# Patient Record
Sex: Male | Born: 1970 | Race: White | Hispanic: No | Marital: Married
Health system: Southern US, Community
[De-identification: ages and names within clinical notes are randomized; demographics above are authoritative.]

## PROBLEM LIST (undated history)

## (undated) DIAGNOSIS — M5136 Other intervertebral disc degeneration, lumbar region: Secondary | ICD-10-CM

## (undated) DIAGNOSIS — G8929 Other chronic pain: Secondary | ICD-10-CM

## (undated) DIAGNOSIS — M549 Dorsalgia, unspecified: Secondary | ICD-10-CM

## (undated) DIAGNOSIS — I10 Essential (primary) hypertension: Secondary | ICD-10-CM

## (undated) DIAGNOSIS — M51369 Other intervertebral disc degeneration, lumbar region without mention of lumbar back pain or lower extremity pain: Secondary | ICD-10-CM

---

## 2015-02-05 ENCOUNTER — Encounter (HOSPITAL_BASED_OUTPATIENT_CLINIC_OR_DEPARTMENT_OTHER): Payer: Self-pay | Admitting: *Deleted

## 2015-02-05 ENCOUNTER — Emergency Department (HOSPITAL_BASED_OUTPATIENT_CLINIC_OR_DEPARTMENT_OTHER)
Admission: EM | Admit: 2015-02-05 | Discharge: 2015-02-05 | Disposition: A | Discharge status: Home / Self Care | Payer: Medicare Other | Attending: Emergency Medicine | Admitting: Emergency Medicine

## 2015-02-05 ENCOUNTER — Emergency Department (HOSPITAL_BASED_OUTPATIENT_CLINIC_OR_DEPARTMENT_OTHER): Payer: Medicare Other

## 2015-02-05 DIAGNOSIS — R600 Localized edema: Secondary | ICD-10-CM | POA: Diagnosis not present

## 2015-02-05 DIAGNOSIS — G8929 Other chronic pain: Secondary | ICD-10-CM | POA: Insufficient documentation

## 2015-02-05 DIAGNOSIS — R0981 Nasal congestion: Secondary | ICD-10-CM | POA: Diagnosis not present

## 2015-02-05 DIAGNOSIS — I10 Essential (primary) hypertension: Secondary | ICD-10-CM | POA: Diagnosis not present

## 2015-02-05 DIAGNOSIS — R51 Headache: Secondary | ICD-10-CM | POA: Insufficient documentation

## 2015-02-05 DIAGNOSIS — Z79899 Other long term (current) drug therapy: Secondary | ICD-10-CM | POA: Diagnosis not present

## 2015-02-05 DIAGNOSIS — M545 Low back pain: Secondary | ICD-10-CM | POA: Insufficient documentation

## 2015-02-05 DIAGNOSIS — M7989 Other specified soft tissue disorders: Secondary | ICD-10-CM | POA: Diagnosis present

## 2015-02-05 HISTORY — DX: Other intervertebral disc degeneration, lumbar region: M51.36

## 2015-02-05 HISTORY — DX: Other chronic pain: G89.29

## 2015-02-05 HISTORY — DX: Other intervertebral disc degeneration, lumbar region without mention of lumbar back pain or lower extremity pain: M51.369

## 2015-02-05 HISTORY — DX: Essential (primary) hypertension: I10

## 2015-02-05 HISTORY — DX: Dorsalgia, unspecified: M54.9

## 2015-02-05 LAB — BASIC METABOLIC PANEL
Anion gap: 4 — ABNORMAL LOW (ref 5–15)
BUN: 14 mg/dL (ref 6–23)
CHLORIDE: 103 mmol/L (ref 96–112)
CO2: 29 mmol/L (ref 19–32)
Calcium: 8.2 mg/dL — ABNORMAL LOW (ref 8.4–10.5)
Creatinine, Ser: 1.07 mg/dL (ref 0.50–1.35)
GFR calc non Af Amer: 84 mL/min — ABNORMAL LOW (ref >90)
Glucose, Bld: 125 mg/dL — ABNORMAL HIGH (ref 70–99)
POTASSIUM: 3.6 mmol/L (ref 3.5–5.1)
SODIUM: 136 mmol/L (ref 135–145)

## 2015-02-05 LAB — CBC WITH DIFFERENTIAL/PLATELET
BASOS ABS: 0 10*3/uL (ref 0.0–0.1)
Basophils Relative: 0 % (ref 0–1)
EOS PCT: 2 % (ref 0–5)
Eosinophils Absolute: 0.2 10*3/uL (ref 0.0–0.7)
HCT: 39.5 % (ref 39.0–52.0)
Hemoglobin: 13.3 g/dL (ref 13.0–17.0)
LYMPHS ABS: 2.2 10*3/uL (ref 0.7–4.0)
LYMPHS PCT: 19 % (ref 12–46)
MCH: 30.2 pg (ref 26.0–34.0)
MCHC: 33.7 g/dL (ref 30.0–36.0)
MCV: 89.8 fL (ref 78.0–100.0)
Monocytes Absolute: 0.8 10*3/uL (ref 0.1–1.0)
Monocytes Relative: 7 % (ref 3–12)
Neutro Abs: 8.2 10*3/uL — ABNORMAL HIGH (ref 1.7–7.7)
Neutrophils Relative %: 72 % (ref 43–77)
PLATELETS: 337 10*3/uL (ref 150–400)
RBC: 4.4 MIL/uL (ref 4.22–5.81)
RDW: 14.5 % (ref 11.5–15.5)
WBC: 11.5 10*3/uL — ABNORMAL HIGH (ref 4.0–10.5)

## 2015-02-05 LAB — BRAIN NATRIURETIC PEPTIDE: B NATRIURETIC PEPTIDE 5: 14.7 pg/mL (ref 0.0–100.0)

## 2015-02-05 MED ORDER — TRIAMTERENE-HCTZ 37.5-25 MG PO TABS: 1.0000 | ORAL_TABLET | Freq: Every day | ORAL | Status: AC

## 2015-02-05 NOTE — ED Provider Notes (Signed)
CSN: [id number]     Arrival date & time 02/05/15  [id number] History   First MD Initiated Contact with Patient 02/05/15 1000     Chief Complaint  Patient presents with  . Foot Swelling     (Consider location/radiation/quality/duration/timing/severity/associated sxs/prior Treatment) HPI  44 year old male presents with bilateral lower extremity swelling that started yesterday. He states that he is currently on lisinopril for hypertension but does not take it often because it makes him cough. He has brought this up to his PCP but it has not been changed. He last took his blood pressure medicines 3 days ago. Before that it was even longer. The patient thought his hands were swollen yesterday but this is improved. He denies chest pain, shortness of breath, or current headache. Has been having intermittent headaches before today but none now. No vomiting or focal weakness. Is complaining of chronic low back pain that is not worse than typical.  Past Medical History  Diagnosis Date  . Hypertension   . Chronic back pain   . DDD (degenerative disc disease), lumbar    No past surgical history on file. No family history on file. History  Substance Use Topics  . Smoking status: Not on file  . Smokeless tobacco: Not on file  . Alcohol Use: Not on file    Review of Systems  Constitutional: Negative for fever.  HENT: Positive for congestion (from allergies).   Respiratory: Negative for shortness of breath.   Cardiovascular: Positive for leg swelling. Negative for chest pain.  Gastrointestinal: Negative for vomiting and abdominal pain.  Genitourinary: Negative for decreased urine volume.  Musculoskeletal: Positive for back pain.  Neurological: Positive for headaches. Negative for weakness and numbness.  All other systems reviewed and are negative.     Allergies  Hydrocodone  Home Medications   Prior to Admission medications   Medication Sig Start Date End Date Taking? Authorizing Provider   LISINOPRIL PO Take by mouth.   Yes Historical Provider, MD   BP 160/96 mmHg  Pulse 88  Temp(Src) 97.8 F (36.6 C) (Oral)  Resp 20  Ht 5\' 11"  (1.803 m)  Wt 220 lb (99.791 kg)  BMI 30.70 kg/m2  SpO2 99% Physical Exam  Constitutional: He is oriented to person, place, and time. He appears well-developed and well-nourished.  HENT:  Head: Normocephalic and atraumatic.  Right Ear: External ear normal.  Left Ear: External ear normal.  Nose: Nose normal.  Eyes: Right eye exhibits no discharge. Left eye exhibits no discharge.  Neck: Neck supple.  Cardiovascular: Normal rate, regular rhythm, normal heart sounds and intact distal pulses.   Pulmonary/Chest: Effort normal.  Mild lower lung crackles bilaterally  Abdominal: Soft. There is no tenderness.  Musculoskeletal: He exhibits edema (1+ pedal edema bilaterally).  Neurological: He is alert and oriented to person, place, and time.  Skin: Skin is warm and dry.  Nursing note and vitals reviewed.   ED Course  Procedures (including critical care time) Labs Review Labs Reviewed  BASIC METABOLIC PANEL - Abnormal; Notable for the following:    Glucose, Bld 125 (*)    Calcium 8.2 (*)    GFR calc non Af Amer 84 (*)    Anion gap 4 (*)    All other components within normal limits  CBC WITH DIFFERENTIAL/PLATELET - Abnormal; Notable for the following:    WBC 11.5 (*)    Neutro Abs 8.2 (*)    All other components within normal limits  BRAIN NATRIURETIC PEPTIDE  Imaging Review Dg Chest 2 View  02/05/2015   CLINICAL DATA:  Bilateral lower extremity swelling.  Smoker.  EXAM: CHEST  2 VIEW  COMPARISON:  Edit  FINDINGS: There is no pulmonary edema or consolidative process. Bronchitic change is noted. Heart size is normal. No pneumothorax or pleural effusion.  IMPRESSION: Bronchitic change.  No focal abnormality.   Electronically Signed   By: Drusilla Kanner M.D.   On: 02/05/2015 10:41     EKG Interpretation None      MDM   Final  diagnoses:  Essential hypertension  Pedal edema    Given mild crackles heard on exam, chest x-ray and labs obtained. His labs are unremarkable. The patient's creatinine is within normal limits. Given he does not like being on his lisinopril and has mild pedal edema, will prescribe triamterene-hydrochlorothiazide to help with both blood pressure and leg swelling. Potassium is on the low end of normal. As for his chronic back pain he has no acute neurologic symptoms now do not feel narcotics are warranted at this time. Recommend follow-up as soon as possible with his PCP.    Pricilla Loveless, MD 02/05/15 1331

## 2015-02-05 NOTE — ED Notes (Addendum)
Patient states he is in [place] visiting a friend and has not had his blood pressure medications for three weeks.  States he has not been compliant with using the Lisinopril, because it makes him cough and his PCP will not change it.  States yesterday he noticed his feet and legs swelling and when waking up today, the swelling was worse.  States he also has lower back pain.

## 2015-04-11 DEATH — deceased

## 2016-12-12 IMAGING — DX DG CHEST 2V
2 series · 2 of 2 positions shown · non-contrast
Comparison: Hospital

CLINICAL DATA: Bilateral lower extremity swelling.  Smoker.

EXAM:
CHEST  2 VIEW

[chest pa]
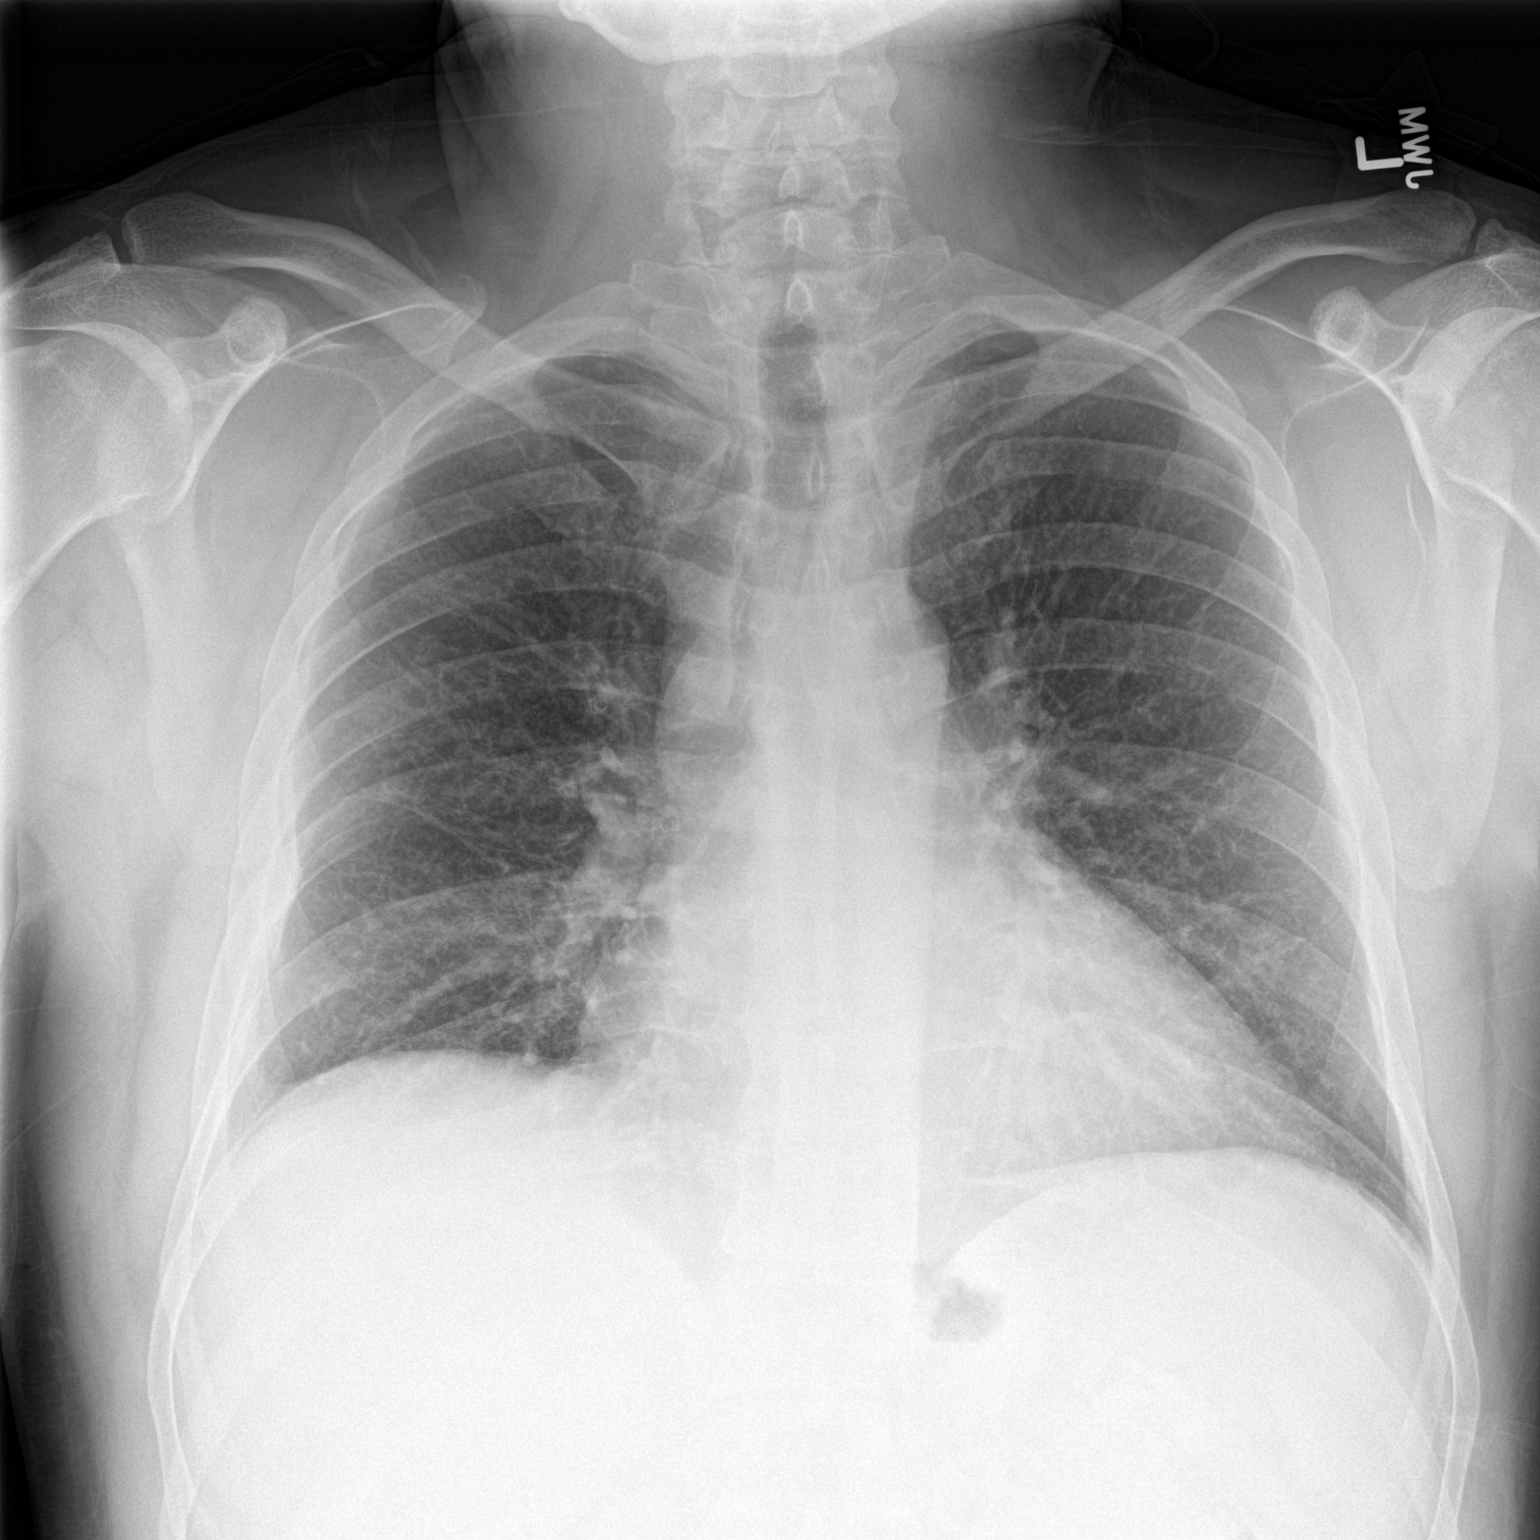

[chest lat]
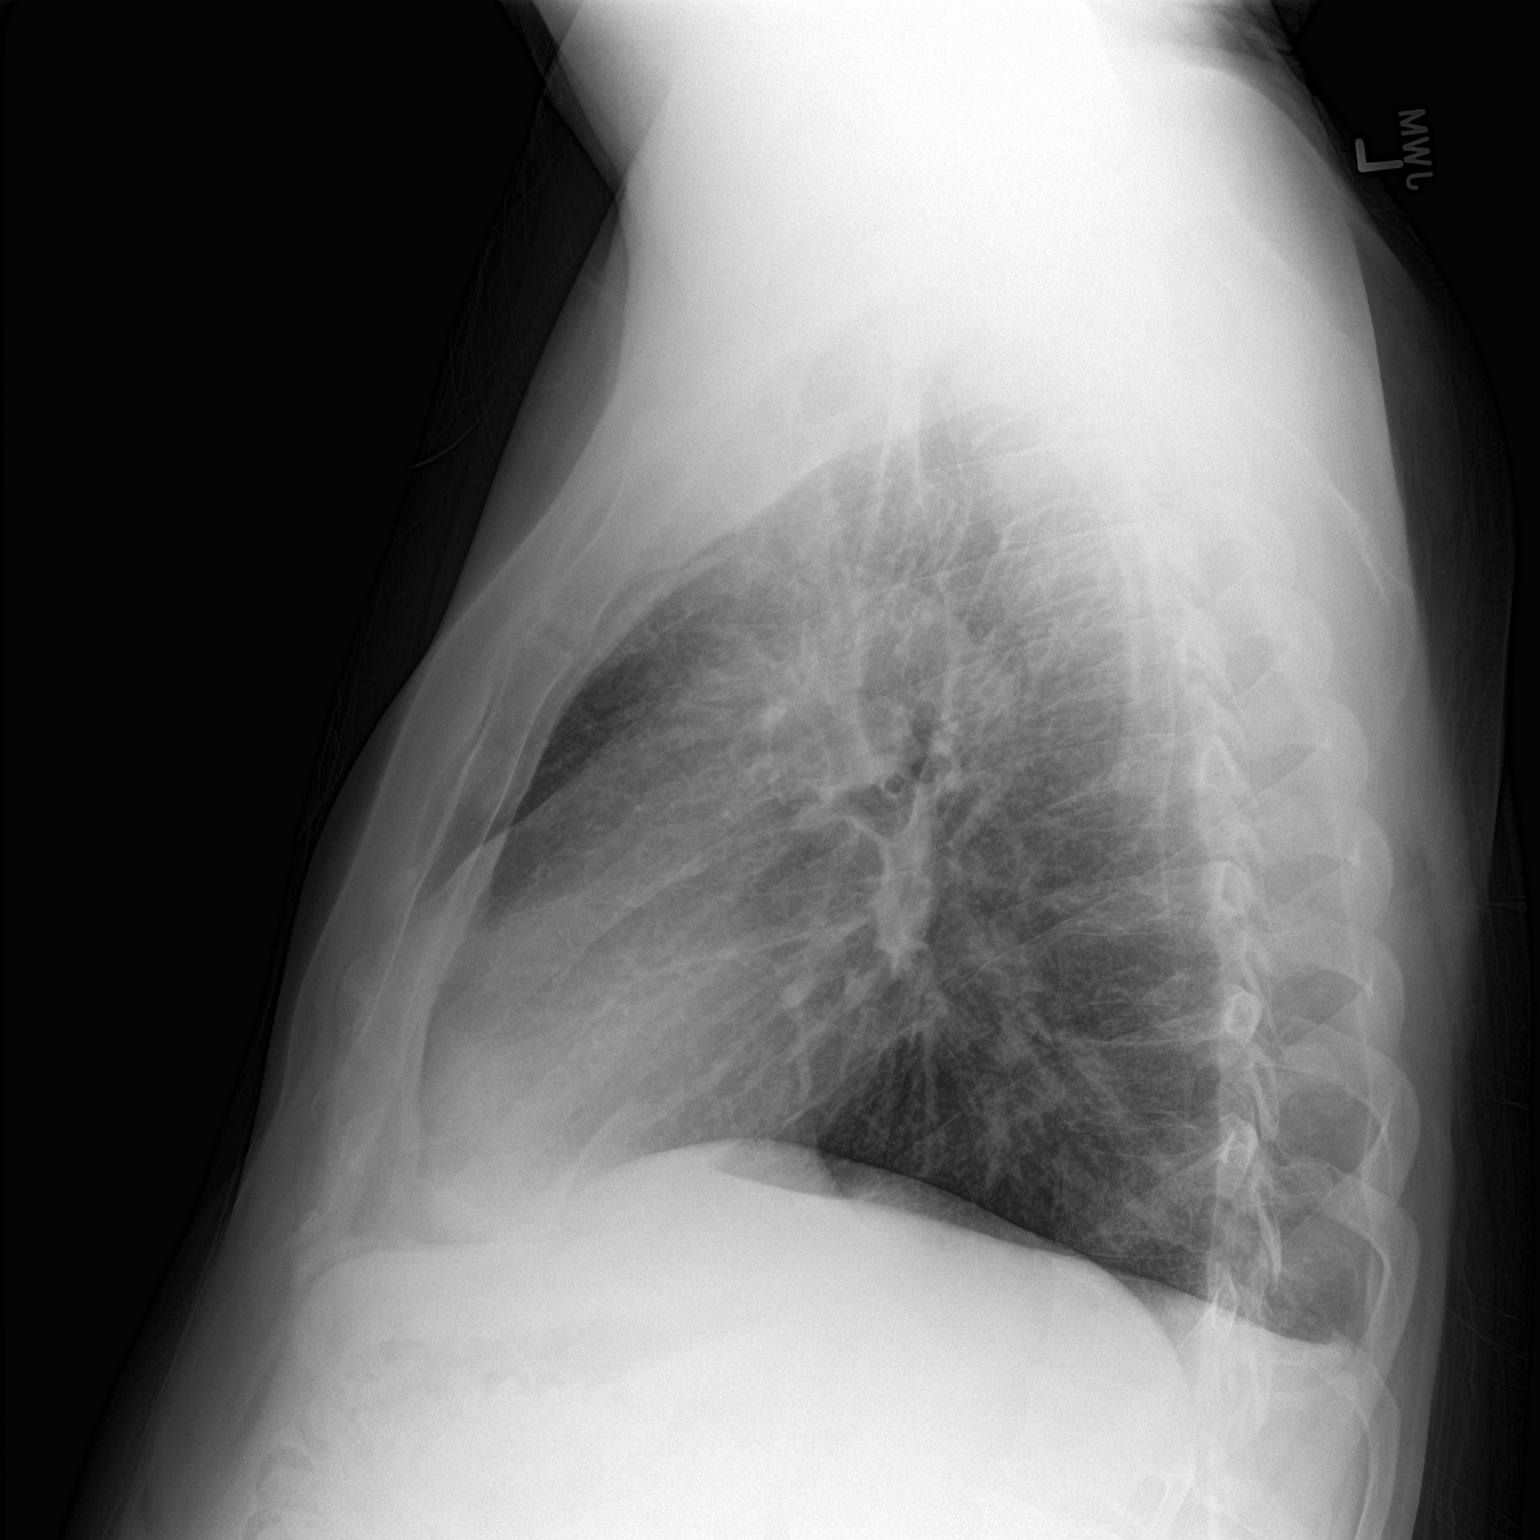

[2 of 2 positions shown; findings below may reference images not displayed]

FINDINGS: There is no pulmonary edema or consolidative process. Bronchitic
change is noted. Heart size is normal. No pneumothorax or pleural
effusion.
IMPRESSION: Bronchitic change.  No focal abnormality.
